# Patient Record
Sex: Male | Born: 2000 | Race: White | Hispanic: Yes | Marital: Single | Smoking: Never smoker
Health system: Southern US, Community
[De-identification: ages and names within clinical notes are randomized; demographics above are authoritative.]

## PROBLEM LIST (undated history)

## (undated) HISTORY — PX: ANKLE SURGERY: SHX546

---
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
## 2021-06-08 DIAGNOSIS — F418 Other specified anxiety disorders: Secondary | ICD-10-CM | POA: Diagnosis not present

## 2022-01-18 DIAGNOSIS — R001 Bradycardia, unspecified: Secondary | ICD-10-CM | POA: Diagnosis not present

## 2022-01-18 DIAGNOSIS — R079 Chest pain, unspecified: Secondary | ICD-10-CM | POA: Diagnosis not present

## 2022-01-18 DIAGNOSIS — R059 Cough, unspecified: Secondary | ICD-10-CM | POA: Diagnosis not present

## 2022-01-18 DIAGNOSIS — J069 Acute upper respiratory infection, unspecified: Secondary | ICD-10-CM | POA: Diagnosis not present

## 2022-03-28 DIAGNOSIS — B349 Viral infection, unspecified: Secondary | ICD-10-CM | POA: Diagnosis not present

## 2022-03-28 DIAGNOSIS — R6889 Other general symptoms and signs: Secondary | ICD-10-CM | POA: Diagnosis not present

## 2022-03-28 DIAGNOSIS — R0981 Nasal congestion: Secondary | ICD-10-CM | POA: Diagnosis not present

## 2022-03-28 DIAGNOSIS — J029 Acute pharyngitis, unspecified: Secondary | ICD-10-CM | POA: Diagnosis not present

## 2022-06-23 DIAGNOSIS — H1033 Unspecified acute conjunctivitis, bilateral: Secondary | ICD-10-CM | POA: Diagnosis not present

## 2022-06-23 DIAGNOSIS — R051 Acute cough: Secondary | ICD-10-CM | POA: Diagnosis not present

## 2022-06-23 DIAGNOSIS — J029 Acute pharyngitis, unspecified: Secondary | ICD-10-CM | POA: Diagnosis not present

## 2022-06-23 DIAGNOSIS — H9202 Otalgia, left ear: Secondary | ICD-10-CM | POA: Diagnosis not present
